# Patient Record
Sex: Female | Born: 1950 | Race: White | Hispanic: No | Marital: Married | Smoking: Never smoker
Health system: Southern US, Community
[De-identification: ages and names within clinical notes are randomized; demographics above are authoritative.]

## PROBLEM LIST (undated history)

## (undated) DIAGNOSIS — J45909 Unspecified asthma, uncomplicated: Secondary | ICD-10-CM

## (undated) DIAGNOSIS — F419 Anxiety disorder, unspecified: Secondary | ICD-10-CM

## (undated) DIAGNOSIS — I10 Essential (primary) hypertension: Secondary | ICD-10-CM

## (undated) DIAGNOSIS — N189 Chronic kidney disease, unspecified: Secondary | ICD-10-CM

## (undated) DIAGNOSIS — E785 Hyperlipidemia, unspecified: Secondary | ICD-10-CM

## (undated) HISTORY — PX: KIDNEY STONE SURGERY: SHX686

## (undated) HISTORY — PX: BREAST BIOPSY: SHX20

## (undated) HISTORY — DX: Unspecified asthma, uncomplicated: J45.909

## (undated) HISTORY — DX: Hyperlipidemia, unspecified: E78.5

## (undated) HISTORY — DX: Essential (primary) hypertension: I10

## (undated) HISTORY — DX: Anxiety disorder, unspecified: F41.9

## (undated) HISTORY — DX: Chronic kidney disease, unspecified: N18.9

---

## 1999-03-30 ENCOUNTER — Other Ambulatory Visit: Admission: RE | Admit: 1999-03-30 | Discharge: 1999-03-30 | Payer: Self-pay | Admitting: Gynecology

## 2000-07-10 ENCOUNTER — Other Ambulatory Visit: Admission: RE | Admit: 2000-07-10 | Discharge: 2000-07-10 | Payer: Self-pay | Admitting: Gynecology

## 2002-02-25 ENCOUNTER — Other Ambulatory Visit: Admission: RE | Admit: 2002-02-25 | Discharge: 2002-02-25 | Payer: Self-pay | Admitting: Gynecology

## 2003-07-07 ENCOUNTER — Other Ambulatory Visit: Admission: RE | Admit: 2003-07-07 | Discharge: 2003-07-07 | Payer: Self-pay | Admitting: Gynecology

## 2003-12-02 ENCOUNTER — Ambulatory Visit (HOSPITAL_COMMUNITY): Admission: RE | Admit: 2003-12-02 | Discharge: 2003-12-02 | Payer: Self-pay | Admitting: Gynecology

## 2004-07-15 ENCOUNTER — Other Ambulatory Visit: Admission: RE | Admit: 2004-07-15 | Discharge: 2004-07-15 | Payer: Self-pay | Admitting: Gynecology

## 2005-10-10 ENCOUNTER — Other Ambulatory Visit: Admission: RE | Admit: 2005-10-10 | Discharge: 2005-10-10 | Payer: Self-pay | Admitting: Gynecology

## 2006-01-02 ENCOUNTER — Ambulatory Visit (HOSPITAL_COMMUNITY): Admission: RE | Admit: 2006-01-02 | Discharge: 2006-01-02 | Payer: Self-pay | Admitting: Gynecology

## 2011-01-09 ENCOUNTER — Encounter: Payer: Self-pay | Admitting: Gynecology

## 2013-10-23 ENCOUNTER — Other Ambulatory Visit: Payer: Self-pay | Admitting: Gynecology

## 2013-10-23 DIAGNOSIS — R928 Other abnormal and inconclusive findings on diagnostic imaging of breast: Secondary | ICD-10-CM

## 2013-11-08 ENCOUNTER — Ambulatory Visit
Admission: RE | Admit: 2013-11-08 | Discharge: 2013-11-08 | Disposition: A | Discharge status: Home / Self Care | Payer: BC Managed Care – PPO | Source: Ambulatory Visit | Attending: Gynecology | Admitting: Gynecology

## 2013-11-08 DIAGNOSIS — R928 Other abnormal and inconclusive findings on diagnostic imaging of breast: Secondary | ICD-10-CM

## 2013-11-11 ENCOUNTER — Other Ambulatory Visit: Payer: Self-pay

## 2014-04-17 ENCOUNTER — Other Ambulatory Visit: Payer: Self-pay | Admitting: *Deleted

## 2014-04-17 DIAGNOSIS — R42 Dizziness and giddiness: Secondary | ICD-10-CM

## 2014-04-18 ENCOUNTER — Encounter: Payer: Self-pay | Admitting: Vascular Surgery

## 2014-05-27 ENCOUNTER — Encounter: Payer: Self-pay | Admitting: Vascular Surgery

## 2014-05-28 ENCOUNTER — Ambulatory Visit (HOSPITAL_COMMUNITY)
Admission: RE | Admit: 2014-05-28 | Discharge: 2014-05-28 | Disposition: A | Discharge status: Home / Self Care | Payer: BC Managed Care – PPO | Source: Ambulatory Visit | Attending: Vascular Surgery | Admitting: Vascular Surgery

## 2014-05-28 ENCOUNTER — Ambulatory Visit (INDEPENDENT_AMBULATORY_CARE_PROVIDER_SITE_OTHER): Payer: BC Managed Care – PPO | Admitting: Vascular Surgery

## 2014-05-28 ENCOUNTER — Encounter: Payer: Self-pay | Admitting: Vascular Surgery

## 2014-05-28 VITALS — BP 113/50 | HR 64 | Ht 62.0 in | Wt 115.1 lb

## 2014-05-28 DIAGNOSIS — R42 Dizziness and giddiness: Secondary | ICD-10-CM

## 2014-05-28 DIAGNOSIS — M79609 Pain in unspecified limb: Secondary | ICD-10-CM

## 2014-05-28 NOTE — Assessment & Plan Note (Signed)
I do not think her pain in the left upper extremity is related to arterial occlusive disease. He has palpable pulses in the left arm a biphasic signals on her previous Doppler study. She has a normally directed flow in her vertebral arteries and mass no evidence of subclavian artery stenosis or occlusion. Given that her pain is aggravated by activity she could potentially have neurogenic thoracic outlet syndrome or some other nerve related cause for her pain. If her symptoms progress and I would recommend evaluation by neurology. I will be happy to see her back at any time if any new vascular issues arise.

## 2014-05-28 NOTE — Progress Notes (Signed)
Patient ID: Brandi Ellis, female   DOB: 1951-11-23, 63 y.o.   MRN: [id number]  Reason for Consult: New Evaluation  Left arm pain Referred by Ernestene Kiel, MD  Subjective:     HPI:  Brandi Ellis is a 63 y.o. female who noted the gradual onset of pain in her left arm several months ago. Initially the pain was in the medial aspect of her upper arm just above the antecubital space. Infection an ultrasound of this area on 08/14/2013 which showed no discrete mass or abnormalities. She states that now the pain involves her entire left upper extremity. This is not constant pain but seems to be aggravated somewhat by certain activities. For example, she works part-time as a Theme park manager in this aggravates her arms somewhat. She denies any problems with dizziness. She denies any neck pain. She has not had any injury to her arm.  I have reviewed her records from Dr. Felipa Emory office. Her hypertension has been well controlled. She does have some issues with anxiety.  Past Medical History  Diagnosis Date  . Hypertension   . Anxiety   . Asthma   . Chronic kidney disease   . Anxiety   . Hyperlipidemia    Family History  Problem Relation Age of Onset  . Cancer Mother   . Hyperlipidemia Mother   . Hypertension Mother   . Varicose Veins Mother   . Heart disease Father     before age 25  . Hypertension Father   . Hyperlipidemia Sister   . Hypertension Sister   . Cancer Brother   . Heart disease Brother     before age 53  . Hyperlipidemia Brother   . Hypertension Brother    Past Surgical History  Procedure Laterality Date  . Cesarean section  1972 and 1977  . Kidney stone surgery     Short Social History:  History  Substance Use Topics  . Smoking status: Never Smoker   . Smokeless tobacco: Never Used  . Alcohol Use: No   Allergies  Allergen Reactions  . Codeine Sulfate   . Lipitor [Atorvastatin]   . Omnipaque [Iohexol]   . Sulfa Antibiotics   . Azithromycin Rash    Review of Systems  Constitutional: Negative for chills and fever.  Eyes: Negative for loss of vision.  Respiratory: Positive for wheezing. Negative for cough.  Cardiovascular: Positive for leg swelling. Negative for chest pain, chest tightness, claudication, dyspnea with exertion, orthopnea and palpitations.  GI: Negative for blood in stool and vomiting.  GU: Negative for dysuria and hematuria.  Musculoskeletal: Negative for leg pain, joint pain and myalgias.  Skin: Negative for rash and wound.  Neurological: Negative for dizziness and speech difficulty.  Hematologic: Negative for bruises/bleeds easily. Psychiatric: Negative for depressed mood.     For VQI Use Only: Pre-ADM Living:  [    ]Home   [    ]Nursing Home  [   ]Homeless Amb Status:  [   ] Walking [   ]Walking w/ assisitance  [   ]Wheelchair  [   ]Bed ridden     Objective:  Objective  Filed Vitals:   05/28/14 0923 05/28/14 0927  BP: 116/59 113/50  Pulse: 64   Height: 5\' 2"  (1.575 m)   Weight: 115 lb 1.6 oz (52.209 kg)   SpO2: 100%    Body mass index is 21.05 kg/(m^2).  Physical Exam  Constitutional: She is oriented to person, place, and time. She appears well-developed and  well-nourished.  HENT:  Head: Normocephalic and atraumatic.  Neck: Neck supple. No JVD present. No thyromegaly present.  Cardiovascular: Normal rate, regular rhythm and normal heart sounds.  Exam reveals no friction rub.   No murmur heard. Pulses:      Radial pulses are 2+ on the right side, and 2+ on the left side.       Dorsalis pedis pulses are 2+ on the right side.       Posterior tibial pulses are 2+ on the right side, and 2+ on the left side.  Pulmonary/Chest: Breath sounds normal. She has no wheezes. She has no rales.  Abdominal: Soft. Bowel sounds are normal. There is no tenderness.  I do not palpate an aneurysm.  Musculoskeletal: Normal range of motion. She exhibits no edema.  Lymphadenopathy:    She has no cervical adenopathy.   Neurological: She is alert and oriented to person, place, and time. She has normal strength. No sensory deficit.  Skin: No lesion and no rash noted.  Psychiatric: She has a normal mood and affect.   Data: I have reviewed her carotid duplex from 03/25/2013 which showed no evidence of carotid disease and normal flow in both vertebral arteries.  I have also reviewed her arterial Doppler study of the left upper extremity which was performed on 04/03/2014 which showed biphasic waveforms in the left upper extremity.      Assessment/Plan:     Pain in limb I do not think her pain in the left upper extremity is related to arterial occlusive disease. He has palpable pulses in the left arm a biphasic signals on her previous Doppler study. She has a normally directed flow in her vertebral arteries and mass no evidence of subclavian artery stenosis or occlusion. Given that her pain is aggravated by activity she could potentially have neurogenic thoracic outlet syndrome or some other nerve related cause for her pain. If her symptoms progress and I would recommend evaluation by neurology. I will be happy to see her back at any time if any new vascular issues arise.   Angelia Mould MD [facility] #

## 2014-11-07 ENCOUNTER — Ambulatory Visit (INDEPENDENT_AMBULATORY_CARE_PROVIDER_SITE_OTHER): Payer: BC Managed Care – PPO

## 2014-11-07 VITALS — BP 116/70 | HR 69 | Resp 12

## 2014-11-07 DIAGNOSIS — S93401A Sprain of unspecified ligament of right ankle, initial encounter: Secondary | ICD-10-CM

## 2014-11-07 DIAGNOSIS — R609 Edema, unspecified: Secondary | ICD-10-CM

## 2014-11-07 DIAGNOSIS — M79604 Pain in right leg: Secondary | ICD-10-CM

## 2014-11-07 NOTE — Progress Notes (Signed)
   Subjective:    Patient ID: Brandi Ellis, female    DOB: [birth year], 63 y.o.   MRN: [id number]  HPI PT STATED SHE FAINTED 3 WEEKS AGO AND WENT TO [facility] RO[facility] BECAUSE  THE RT LATERAL SIDE OF THE FOOT START HURTING AND THROBBING FOR 3 WEEKS. THE FOOT IS GETTING WORSE AND FOOT GET AGGRAVATED BY WALKING/SITTING. TRIED ICING BUT NO HELP.   Review of Systems  Constitutional: Positive for activity change.  HENT: Positive for sinus pressure.   Musculoskeletal: Positive for gait problem.  Hematological: Bruises/bleeds easily.  All other systems reviewed and are negative.      Objective:   Physical Exam 63 year old white female well-developed well-nourished oriented 3 presents at this time proxy 3 weeks status post lateral ankle sprain of the right ankle. The sprain occurred when she was walking shoe. We fainted and turned her ankle has pain along the lateral malleolar area extending from the lateral malleolus lateral ankle all the way to the base of the fifth metatarsal lateral midfoot. There is history of ecchymosis and continues to have pain on direct palpation most significant pain in the sinus tarsi over the anterior talofibular ligament and calcaneal fibular ligament posterior to the malleolus is not as painful and tender. X-rays taken a week ago at [facility] revealed possibly some irritation or periosteal reaction of the fibular head on the ankle views however no acute fractures identified no displacements the ankle mortise is intact with no significant joint space changes noted. No fractures cyst tumors no other osseous abnormalities noted. Again neurovascular status is intact pedal pulses are palpable epicritic and proprioceptive sensations intact and symmetric bilateral there is normal plantar response and DTRs. Dermatologic his skin color pigment normal history of ecchymosis although that is cleared       Assessment & Plan:  Assessment lateral ankle sprain likely  tear of the anterior talofibular possible calcaneal fib ligament involvement plan at this time patient placed in an ankle stabilizer maintained for 3-4 weeks as instructed instructions are provided for ankle stabilization with a stabilizer AFO brace maintained in recheck in 4 weeks for follow-up also recommended ice and elevation whenever possible and moderation of activities maintain a good stable shoe avoid any uneven are unstable surfaces. Reschedule in 4 weeks  Harriet Masson DPM

## 2014-11-07 NOTE — Patient Instructions (Signed)
Ankle Sprain An ankle sprain is an injury to the strong, fibrous tissues (ligaments) that hold the bones of your ankle joint together.  CAUSES An ankle sprain is usually caused by a fall or by twisting your ankle. Ankle sprains most commonly occur when you step on the outer edge of your foot, and your ankle turns inward. People who participate in sports are more prone to these types of injuries.  SYMPTOMS   Pain in your ankle. The pain may be present at rest or only when you are trying to stand or walk.  Swelling.  Bruising. Bruising may develop immediately or within 1 to 2 days after your injury.  Difficulty standing or walking, particularly when turning corners or changing directions. DIAGNOSIS  Your caregiver will ask you details about your injury and perform a physical exam of your ankle to determine if you have an ankle sprain. During the physical exam, your caregiver will press on and apply pressure to specific areas of your foot and ankle. Your caregiver will try to move your ankle in certain ways. An X-ray exam may be done to be sure a bone was not broken or a ligament did not separate from one of the bones in your ankle (avulsion fracture).  TREATMENT  Certain types of braces can help stabilize your ankle. Your caregiver can make a recommendation for this. Your caregiver may recommend the use of medicine for pain. If your sprain is severe, your caregiver may refer you to a surgeon who helps to restore function to parts of your skeletal system (orthopedist) or a physical therapist. [place] ice to your injury for 1-2 days or as directed by your caregiver. Applying ice helps to reduce inflammation and pain.  Put ice in a plastic bag.  Place a towel between your skin and the bag.  Leave the ice on for 15-20 minutes at a time, every 2 hours while you are awake.  Only take over-the-counter or prescription medicines for pain, discomfort, or fever as directed by  your caregiver.  Elevate your injured ankle above the level of your heart as much as possible for 2-3 days.  If your caregiver recommends crutches, use them as instructed. Gradually put weight on the affected ankle. Continue to use crutches or a cane until you can walk without feeling pain in your ankle.  If you have a plaster splint, wear the splint as directed by your caregiver. Do not rest it on anything harder than a pillow for the first 24 hours. Do not put weight on it. Do not get it wet. You may take it off to take a shower or bath.  You may have been given an elastic bandage to wear around your ankle to provide support. If the elastic bandage is too tight (you have numbness or tingling in your foot or your foot becomes cold and blue), adjust the bandage to make it comfortable.  If you have an air splint, you may blow more air into it or let air out to make it more comfortable. You may take your splint off at night and before taking a shower or bath. Wiggle your toes in the splint several times per day to decrease swelling. SEEK MEDICAL CARE IF:   You have rapidly increasing bruising or swelling.  Your toes feel extremely cold or you lose feeling in your foot.  Your pain is not relieved with medicine. SEEK IMMEDIATE MEDICAL CARE IF:  Your toes are numb or blue.  You have severe pain that is increasing. MAKE SURE YOU:   Understand these instructions.  Will watch your condition.  Will get help right away if you are not doing well or get worse. Document Released: 12/05/2005 Document Revised: 08/29/2012 Document Reviewed: 12/17/2011 [facility] Patient Information 2015 [place], [place]. This information is not intended to replace advice given to you by your health care provider. Make sure you discuss any questions you have with your health care provider.  Maintain the ankle stabilizer or 3-4 weeks as instructed, to allow the ligaments to strengthen and heal

## 2014-12-05 ENCOUNTER — Ambulatory Visit (INDEPENDENT_AMBULATORY_CARE_PROVIDER_SITE_OTHER): Payer: BC Managed Care – PPO

## 2014-12-05 VITALS — BP 110/64 | HR 76 | Resp 12

## 2014-12-05 DIAGNOSIS — S93401A Sprain of unspecified ligament of right ankle, initial encounter: Secondary | ICD-10-CM

## 2014-12-05 DIAGNOSIS — M79604 Pain in right leg: Secondary | ICD-10-CM

## 2014-12-05 DIAGNOSIS — R609 Edema, unspecified: Secondary | ICD-10-CM

## 2014-12-05 NOTE — Progress Notes (Signed)
   Subjective:    Patient ID: Brandi Ellis, female    DOB: [birth year], 63 y.o.   MRN: [id number]  HPI  ''RT FOOT ANKLE STILL HURTING ESPECIALLY WHEN PUTTING PRESSURE ON IT.''  Review of Systems no new findings or systemic changes noted     Objective:   Physical Exam  Lower extremity objective findings unchanged patient has a lateral ankle sprain of the right ankle ecchymosis of the ankle has cleared at this time still tender along the anterior talofibular and calcaneofibular ligament areas also some tenderness and mid leg patient been inversion type sprain of the medial ankle does have some slight tenderness per hour mostly patient is complaining of aching or throbbing sensation no sharp shooting pain no click no crepitus in the joint and movement of the ankle      Assessment & Plan:  Follow-up at this time reveals improving ankle sprain with managed edema no ecchymosis noted no increased pain tenderness discomfort no dehiscence there ankle seems to be stabilizing well is wearing the ankle stabilizer most the time. This time stressed using stabilizer all times with a good walking or athletic shoe maintain stabilizer for at least another 1-2 months as instructed if not improved or resolving within 2 months follow-up may be candidate for MRI evaluation for soft tissue injury or damage nonhealing ligament injury or cartilaginous damage. Reappointed 2 months as indicated  Harriet Masson DPM

## 2014-12-05 NOTE — Patient Instructions (Signed)

## 2014-12-29 ENCOUNTER — Ambulatory Visit (INDEPENDENT_AMBULATORY_CARE_PROVIDER_SITE_OTHER): Payer: BLUE CROSS/BLUE SHIELD

## 2014-12-29 VITALS — BP 112/70 | HR 88 | Resp 12

## 2014-12-29 DIAGNOSIS — M5416 Radiculopathy, lumbar region: Secondary | ICD-10-CM

## 2014-12-29 DIAGNOSIS — M7751 Other enthesopathy of right foot: Secondary | ICD-10-CM

## 2014-12-29 DIAGNOSIS — S93401D Sprain of unspecified ligament of right ankle, subsequent encounter: Secondary | ICD-10-CM

## 2014-12-29 DIAGNOSIS — M79604 Pain in right leg: Secondary | ICD-10-CM

## 2014-12-29 DIAGNOSIS — M158 Other polyosteoarthritis: Secondary | ICD-10-CM

## 2014-12-29 DIAGNOSIS — M5431 Sciatica, right side: Secondary | ICD-10-CM

## 2014-12-29 NOTE — Progress Notes (Signed)
   Subjective:    Patient ID: Brandi Ellis, female    DOB: Dec 03, 1951, 64 y.o.   MRN: [id number]  HPI  ''RT FOOT ANKLE STILL THROBS AND PAIN GOING UP THE LEG/HIP.'' Recently the patient is at a car for 5 hours on a trip and after the trip developed pain aching and throbbing abnormal sensations shooting sensation from the hip down into the foot leg. Subtalar abnormal sensation was noted no history of injury or back problems in the past however does have aching in the hip area and upper and lower leg areas. She's tried some anti-inflammatories with minimal improvement  Review of Systems no new findings or systemic changes noted     Objective:   Physical Exam Lower extremity objective findings reveal pedal pulses palpable DP and PT +2 over 4 Refill time 3 seconds epicritic and proprioceptive sensations intact and symmetric bilateral there is normal plantar response and DTRs noted dermatologically skin color pigment normal hair growth absent nails unremarkable orthopedic exam there is rectus foot type ankle metatarsal subtalar joint motions normal there is some tenderness and Lisfranc's both the sinus tarsi area and somewhat along Lisfranc's fourth fifth metatarsal base and cuboid on inversion and eversion this is consistent with mild osteoarthropathy of the foot and then residual sequela of her right lateral ankle sprain. There is no acute pain or tenderness on inversion or eversion or pressure in the sinus tarsi or ankle mortise. No new x-rays taken at this time. Slight about 2 months since her initial injury advised that the ankle sprain can take to 3 months or more to resolve however the swelling is going down ecchymosis is gone most her pain is not in the ankle at this time she is actually complaining more about pain in her leg and hip area and thigh area.       Assessment & Plan:  Assessment resolving are improving ankle sprain lateral right ankle however this time cannot rule out a lumbar  radiculopathy or lumbar sciatic type symptomology of her right hip and lower leg. At this time I feel patient would benefit from evaluation of her hip and lumbar spine by either orthoses or neuro and she has requested [facility] [place] [facility] has been seen by Dr. Dianna Limbo past referred to [facility] orthopedics. Patient warrants an evaluation of her hip and back rule out possibly some degree of arthropathy versus any neurologic manifestations which could be which would worn alternate type treatment. Whether this back problems associated with her fall or injury is inconclusive patient in the past did not complain anything about hip or back or thigh pain her pain and been strictly around the ankle to be more new symptom which developed after her 5 hour car trip. Referred to orthopedics follow-up with foot problems if continue be an issue with the next month or 2 would be candidate for MRI of the foot  . Harriet Masson DPM

## 2014-12-29 NOTE — Patient Instructions (Signed)
ICE INSTRUCTIONS  Apply ice or cold pack to the affected area at least 3 times a day for 10-15 minutes each time.  You should also use ice after prolonged activity or vigorous exercise.  Do not apply ice longer than 20 minutes at one time.  Always keep a cloth between your skin and the ice pack to prevent burns.  Being consistent and following these instructions will help control your symptoms.  We suggest you purchase a gel ice pack because they are reusable and do bit leak.  Some of them are designed to wrap around the area.  Use the method that works best for you.  Here are some other suggestions for icing.   Use a frozen bag of peas or corn-inexpensive and molds well to your body, usually stays frozen for 10 to 20 minutes.  Wet a towel with cold water and squeeze out the excess until it's damp.  Place in a bag in the freezer for 20 minutes. Then remove and use.  Continue with ice and using the ankle stabilizer for right ankle sprain. Next  However if you continue have increased leg and hip pain and abnormality follow-up with orthopedics as recommended for evaluation of either lumbar radiculopathy or sciatic type issues affecting the right side.

## 2015-01-01 ENCOUNTER — Ambulatory Visit: Payer: Self-pay

## 2015-12-23 ENCOUNTER — Other Ambulatory Visit: Payer: Self-pay | Admitting: Obstetrics & Gynecology

## 2015-12-23 DIAGNOSIS — E2839 Other primary ovarian failure: Secondary | ICD-10-CM

## 2016-01-21 ENCOUNTER — Ambulatory Visit
Admission: RE | Admit: 2016-01-21 | Discharge: 2016-01-21 | Disposition: A | Discharge status: Home / Self Care | Payer: BLUE CROSS/BLUE SHIELD | Source: Ambulatory Visit | Attending: Obstetrics & Gynecology | Admitting: Obstetrics & Gynecology

## 2016-01-21 DIAGNOSIS — E2839 Other primary ovarian failure: Secondary | ICD-10-CM

## 2016-05-11 DIAGNOSIS — R21 Rash and other nonspecific skin eruption: Secondary | ICD-10-CM | POA: Diagnosis not present

## 2016-05-26 DIAGNOSIS — Z79899 Other long term (current) drug therapy: Secondary | ICD-10-CM | POA: Diagnosis not present

## 2016-05-26 DIAGNOSIS — E785 Hyperlipidemia, unspecified: Secondary | ICD-10-CM | POA: Diagnosis not present

## 2016-05-26 DIAGNOSIS — I10 Essential (primary) hypertension: Secondary | ICD-10-CM | POA: Diagnosis not present

## 2016-08-31 DIAGNOSIS — J019 Acute sinusitis, unspecified: Secondary | ICD-10-CM | POA: Diagnosis not present

## 2016-11-25 DIAGNOSIS — M549 Dorsalgia, unspecified: Secondary | ICD-10-CM | POA: Diagnosis not present

## 2016-11-25 DIAGNOSIS — Z1231 Encounter for screening mammogram for malignant neoplasm of breast: Secondary | ICD-10-CM | POA: Diagnosis not present

## 2016-11-25 DIAGNOSIS — M810 Age-related osteoporosis without current pathological fracture: Secondary | ICD-10-CM | POA: Diagnosis not present

## 2016-11-25 DIAGNOSIS — Z79899 Other long term (current) drug therapy: Secondary | ICD-10-CM | POA: Diagnosis not present

## 2016-11-25 DIAGNOSIS — I10 Essential (primary) hypertension: Secondary | ICD-10-CM | POA: Diagnosis not present

## 2016-12-26 DIAGNOSIS — M810 Age-related osteoporosis without current pathological fracture: Secondary | ICD-10-CM | POA: Diagnosis not present

## 2016-12-30 DIAGNOSIS — Z1231 Encounter for screening mammogram for malignant neoplasm of breast: Secondary | ICD-10-CM | POA: Diagnosis not present

## 2017-03-16 DIAGNOSIS — Z1211 Encounter for screening for malignant neoplasm of colon: Secondary | ICD-10-CM | POA: Diagnosis not present

## 2017-04-12 DIAGNOSIS — J0100 Acute maxillary sinusitis, unspecified: Secondary | ICD-10-CM | POA: Diagnosis not present

## 2017-05-22 DIAGNOSIS — Z1211 Encounter for screening for malignant neoplasm of colon: Secondary | ICD-10-CM | POA: Diagnosis not present

## 2017-05-22 DIAGNOSIS — I10 Essential (primary) hypertension: Secondary | ICD-10-CM | POA: Diagnosis not present

## 2017-05-22 DIAGNOSIS — K5730 Diverticulosis of large intestine without perforation or abscess without bleeding: Secondary | ICD-10-CM | POA: Diagnosis not present

## 2017-05-22 DIAGNOSIS — K635 Polyp of colon: Secondary | ICD-10-CM | POA: Diagnosis not present

## 2017-05-22 DIAGNOSIS — K648 Other hemorrhoids: Secondary | ICD-10-CM | POA: Diagnosis not present

## 2017-05-22 DIAGNOSIS — D122 Benign neoplasm of ascending colon: Secondary | ICD-10-CM | POA: Diagnosis not present

## 2017-05-26 DIAGNOSIS — R55 Syncope and collapse: Secondary | ICD-10-CM | POA: Diagnosis not present

## 2017-05-26 DIAGNOSIS — E876 Hypokalemia: Secondary | ICD-10-CM | POA: Diagnosis not present

## 2017-05-26 DIAGNOSIS — I10 Essential (primary) hypertension: Secondary | ICD-10-CM | POA: Diagnosis not present

## 2017-05-26 DIAGNOSIS — Z79899 Other long term (current) drug therapy: Secondary | ICD-10-CM | POA: Diagnosis not present

## 2017-09-16 DIAGNOSIS — H8122 Vestibular neuronitis, left ear: Secondary | ICD-10-CM | POA: Diagnosis not present

## 2017-09-27 DIAGNOSIS — I10 Essential (primary) hypertension: Secondary | ICD-10-CM | POA: Diagnosis not present

## 2017-09-27 DIAGNOSIS — E785 Hyperlipidemia, unspecified: Secondary | ICD-10-CM | POA: Diagnosis not present

## 2017-09-27 DIAGNOSIS — R21 Rash and other nonspecific skin eruption: Secondary | ICD-10-CM | POA: Diagnosis not present

## 2017-09-27 DIAGNOSIS — M810 Age-related osteoporosis without current pathological fracture: Secondary | ICD-10-CM | POA: Diagnosis not present

## 2017-09-27 DIAGNOSIS — Z79899 Other long term (current) drug therapy: Secondary | ICD-10-CM | POA: Diagnosis not present

## 2017-12-08 DIAGNOSIS — J0140 Acute pansinusitis, unspecified: Secondary | ICD-10-CM | POA: Diagnosis not present

## 2017-12-08 DIAGNOSIS — R52 Pain, unspecified: Secondary | ICD-10-CM | POA: Diagnosis not present

## 2017-12-08 DIAGNOSIS — R05 Cough: Secondary | ICD-10-CM | POA: Diagnosis not present

## 2017-12-18 DIAGNOSIS — R5381 Other malaise: Secondary | ICD-10-CM | POA: Diagnosis not present

## 2017-12-18 DIAGNOSIS — R062 Wheezing: Secondary | ICD-10-CM | POA: Diagnosis not present

## 2017-12-18 DIAGNOSIS — R5383 Other fatigue: Secondary | ICD-10-CM | POA: Diagnosis not present

## 2018-01-11 DIAGNOSIS — R05 Cough: Secondary | ICD-10-CM | POA: Diagnosis not present

## 2018-01-11 DIAGNOSIS — H6981 Other specified disorders of Eustachian tube, right ear: Secondary | ICD-10-CM | POA: Diagnosis not present

## 2018-01-11 DIAGNOSIS — J019 Acute sinusitis, unspecified: Secondary | ICD-10-CM | POA: Diagnosis not present

## 2018-01-15 DIAGNOSIS — R0602 Shortness of breath: Secondary | ICD-10-CM | POA: Diagnosis not present

## 2018-01-24 DIAGNOSIS — Z124 Encounter for screening for malignant neoplasm of cervix: Secondary | ICD-10-CM | POA: Diagnosis not present

## 2018-01-24 DIAGNOSIS — Z1231 Encounter for screening mammogram for malignant neoplasm of breast: Secondary | ICD-10-CM | POA: Diagnosis not present

## 2018-01-24 DIAGNOSIS — Z01419 Encounter for gynecological examination (general) (routine) without abnormal findings: Secondary | ICD-10-CM | POA: Diagnosis not present

## 2018-01-29 DIAGNOSIS — M810 Age-related osteoporosis without current pathological fracture: Secondary | ICD-10-CM | POA: Diagnosis not present

## 2018-01-29 DIAGNOSIS — E785 Hyperlipidemia, unspecified: Secondary | ICD-10-CM | POA: Diagnosis not present

## 2018-01-29 DIAGNOSIS — I10 Essential (primary) hypertension: Secondary | ICD-10-CM | POA: Diagnosis not present

## 2018-01-29 DIAGNOSIS — Z79899 Other long term (current) drug therapy: Secondary | ICD-10-CM | POA: Diagnosis not present

## 2018-03-28 DIAGNOSIS — M549 Dorsalgia, unspecified: Secondary | ICD-10-CM | POA: Diagnosis not present

## 2018-05-17 DIAGNOSIS — M25561 Pain in right knee: Secondary | ICD-10-CM | POA: Diagnosis not present

## 2018-05-17 DIAGNOSIS — M79671 Pain in right foot: Secondary | ICD-10-CM | POA: Diagnosis not present

## 2018-05-17 DIAGNOSIS — M549 Dorsalgia, unspecified: Secondary | ICD-10-CM | POA: Diagnosis not present

## 2018-05-18 DIAGNOSIS — M25561 Pain in right knee: Secondary | ICD-10-CM | POA: Diagnosis not present

## 2018-05-18 DIAGNOSIS — S8991XA Unspecified injury of right lower leg, initial encounter: Secondary | ICD-10-CM | POA: Diagnosis not present

## 2018-06-18 DIAGNOSIS — J069 Acute upper respiratory infection, unspecified: Secondary | ICD-10-CM | POA: Diagnosis not present

## 2018-07-30 DIAGNOSIS — R110 Nausea: Secondary | ICD-10-CM | POA: Diagnosis not present

## 2018-07-30 DIAGNOSIS — R490 Dysphonia: Secondary | ICD-10-CM | POA: Diagnosis not present

## 2018-07-30 DIAGNOSIS — I10 Essential (primary) hypertension: Secondary | ICD-10-CM | POA: Diagnosis not present

## 2018-07-30 DIAGNOSIS — M25512 Pain in left shoulder: Secondary | ICD-10-CM | POA: Diagnosis not present

## 2018-07-30 DIAGNOSIS — R5383 Other fatigue: Secondary | ICD-10-CM | POA: Diagnosis not present

## 2018-07-30 DIAGNOSIS — E785 Hyperlipidemia, unspecified: Secondary | ICD-10-CM | POA: Diagnosis not present

## 2018-07-30 DIAGNOSIS — Z79899 Other long term (current) drug therapy: Secondary | ICD-10-CM | POA: Diagnosis not present

## 2018-09-25 DIAGNOSIS — M5441 Lumbago with sciatica, right side: Secondary | ICD-10-CM | POA: Diagnosis not present

## 2018-09-25 DIAGNOSIS — Z0001 Encounter for general adult medical examination with abnormal findings: Secondary | ICD-10-CM | POA: Diagnosis not present

## 2018-09-25 DIAGNOSIS — D692 Other nonthrombocytopenic purpura: Secondary | ICD-10-CM | POA: Diagnosis not present

## 2018-10-19 DIAGNOSIS — M545 Low back pain: Secondary | ICD-10-CM | POA: Diagnosis not present

## 2018-10-25 DIAGNOSIS — M545 Low back pain: Secondary | ICD-10-CM | POA: Diagnosis not present

## 2018-10-25 DIAGNOSIS — M9904 Segmental and somatic dysfunction of sacral region: Secondary | ICD-10-CM | POA: Diagnosis not present

## 2018-10-29 DIAGNOSIS — H6593 Unspecified nonsuppurative otitis media, bilateral: Secondary | ICD-10-CM | POA: Diagnosis not present

## 2018-11-01 DIAGNOSIS — H66001 Acute suppurative otitis media without spontaneous rupture of ear drum, right ear: Secondary | ICD-10-CM | POA: Diagnosis not present

## 2018-11-13 DIAGNOSIS — H5203 Hypermetropia, bilateral: Secondary | ICD-10-CM | POA: Diagnosis not present

## 2018-12-20 DIAGNOSIS — J4521 Mild intermittent asthma with (acute) exacerbation: Secondary | ICD-10-CM | POA: Diagnosis not present

## 2018-12-20 DIAGNOSIS — J0140 Acute pansinusitis, unspecified: Secondary | ICD-10-CM | POA: Diagnosis not present

## 2019-01-30 DIAGNOSIS — R42 Dizziness and giddiness: Secondary | ICD-10-CM | POA: Diagnosis not present

## 2019-01-30 DIAGNOSIS — J0140 Acute pansinusitis, unspecified: Secondary | ICD-10-CM | POA: Diagnosis not present

## 2019-03-06 DIAGNOSIS — M9904 Segmental and somatic dysfunction of sacral region: Secondary | ICD-10-CM | POA: Diagnosis not present

## 2019-06-27 DIAGNOSIS — I639 Cerebral infarction, unspecified: Secondary | ICD-10-CM | POA: Diagnosis not present

## 2019-06-27 DIAGNOSIS — R531 Weakness: Secondary | ICD-10-CM | POA: Diagnosis not present

## 2019-06-27 DIAGNOSIS — G459 Transient cerebral ischemic attack, unspecified: Secondary | ICD-10-CM | POA: Diagnosis not present

## 2019-06-27 DIAGNOSIS — A419 Sepsis, unspecified organism: Secondary | ICD-10-CM | POA: Diagnosis not present

## 2019-06-27 DIAGNOSIS — J4520 Mild intermittent asthma, uncomplicated: Secondary | ICD-10-CM | POA: Diagnosis not present

## 2019-06-27 DIAGNOSIS — E785 Hyperlipidemia, unspecified: Secondary | ICD-10-CM | POA: Diagnosis not present

## 2019-06-27 DIAGNOSIS — B9689 Other specified bacterial agents as the cause of diseases classified elsewhere: Secondary | ICD-10-CM | POA: Diagnosis not present

## 2019-06-27 DIAGNOSIS — R29898 Other symptoms and signs involving the musculoskeletal system: Secondary | ICD-10-CM | POA: Diagnosis not present

## 2019-06-27 DIAGNOSIS — I10 Essential (primary) hypertension: Secondary | ICD-10-CM | POA: Diagnosis not present

## 2019-06-27 DIAGNOSIS — R200 Anesthesia of skin: Secondary | ICD-10-CM | POA: Diagnosis not present

## 2019-06-27 DIAGNOSIS — R55 Syncope and collapse: Secondary | ICD-10-CM | POA: Diagnosis not present

## 2019-06-27 DIAGNOSIS — R202 Paresthesia of skin: Secondary | ICD-10-CM | POA: Diagnosis not present

## 2019-06-27 DIAGNOSIS — Z79899 Other long term (current) drug therapy: Secondary | ICD-10-CM | POA: Diagnosis not present

## 2019-06-27 DIAGNOSIS — J019 Acute sinusitis, unspecified: Secondary | ICD-10-CM | POA: Diagnosis not present

## 2019-06-27 DIAGNOSIS — R42 Dizziness and giddiness: Secondary | ICD-10-CM | POA: Diagnosis not present

## 2019-06-28 DIAGNOSIS — I371 Nonrheumatic pulmonary valve insufficiency: Secondary | ICD-10-CM | POA: Diagnosis not present

## 2019-06-28 DIAGNOSIS — R55 Syncope and collapse: Secondary | ICD-10-CM | POA: Diagnosis not present

## 2019-06-28 DIAGNOSIS — I081 Rheumatic disorders of both mitral and tricuspid valves: Secondary | ICD-10-CM | POA: Diagnosis not present

## 2019-06-28 DIAGNOSIS — G459 Transient cerebral ischemic attack, unspecified: Secondary | ICD-10-CM | POA: Diagnosis not present

## 2019-06-28 DIAGNOSIS — E785 Hyperlipidemia, unspecified: Secondary | ICD-10-CM | POA: Diagnosis not present

## 2019-06-28 DIAGNOSIS — I10 Essential (primary) hypertension: Secondary | ICD-10-CM | POA: Diagnosis not present

## 2019-07-25 DIAGNOSIS — R55 Syncope and collapse: Secondary | ICD-10-CM | POA: Diagnosis not present

## 2019-07-25 DIAGNOSIS — E538 Deficiency of other specified B group vitamins: Secondary | ICD-10-CM | POA: Diagnosis not present

## 2019-07-25 DIAGNOSIS — R5383 Other fatigue: Secondary | ICD-10-CM | POA: Diagnosis not present

## 2019-07-25 DIAGNOSIS — I10 Essential (primary) hypertension: Secondary | ICD-10-CM | POA: Diagnosis not present

## 2019-10-21 ENCOUNTER — Other Ambulatory Visit: Payer: Self-pay | Admitting: Internal Medicine

## 2019-10-21 ENCOUNTER — Other Ambulatory Visit: Payer: Self-pay | Admitting: Obstetrics & Gynecology

## 2019-10-21 DIAGNOSIS — Z1231 Encounter for screening mammogram for malignant neoplasm of breast: Secondary | ICD-10-CM

## 2019-10-25 ENCOUNTER — Ambulatory Visit
Admission: RE | Admit: 2019-10-25 | Discharge: 2019-10-25 | Disposition: A | Discharge status: Home / Self Care | Payer: Medicare Other | Source: Ambulatory Visit | Attending: Internal Medicine | Admitting: Internal Medicine

## 2019-10-25 ENCOUNTER — Other Ambulatory Visit: Payer: Self-pay

## 2019-10-25 DIAGNOSIS — Z1231 Encounter for screening mammogram for malignant neoplasm of breast: Secondary | ICD-10-CM | POA: Diagnosis not present

## 2019-11-25 DIAGNOSIS — J069 Acute upper respiratory infection, unspecified: Secondary | ICD-10-CM | POA: Diagnosis not present

## 2019-12-09 DIAGNOSIS — H66001 Acute suppurative otitis media without spontaneous rupture of ear drum, right ear: Secondary | ICD-10-CM | POA: Diagnosis not present

## 2019-12-09 DIAGNOSIS — J00 Acute nasopharyngitis [common cold]: Secondary | ICD-10-CM | POA: Diagnosis not present

## 2019-12-25 DIAGNOSIS — R5383 Other fatigue: Secondary | ICD-10-CM | POA: Diagnosis not present

## 2019-12-25 DIAGNOSIS — Z79899 Other long term (current) drug therapy: Secondary | ICD-10-CM | POA: Diagnosis not present

## 2019-12-25 DIAGNOSIS — I10 Essential (primary) hypertension: Secondary | ICD-10-CM | POA: Diagnosis not present

## 2019-12-25 DIAGNOSIS — J302 Other seasonal allergic rhinitis: Secondary | ICD-10-CM | POA: Diagnosis not present

## 2019-12-25 DIAGNOSIS — E538 Deficiency of other specified B group vitamins: Secondary | ICD-10-CM | POA: Diagnosis not present

## 2020-01-02 DIAGNOSIS — B029 Zoster without complications: Secondary | ICD-10-CM | POA: Diagnosis not present

## 2020-01-27 DIAGNOSIS — Z743 Need for continuous supervision: Secondary | ICD-10-CM | POA: Diagnosis not present

## 2020-01-27 DIAGNOSIS — I493 Ventricular premature depolarization: Secondary | ICD-10-CM | POA: Diagnosis not present

## 2020-01-27 DIAGNOSIS — Z79899 Other long term (current) drug therapy: Secondary | ICD-10-CM | POA: Diagnosis not present

## 2020-01-27 DIAGNOSIS — Z7951 Long term (current) use of inhaled steroids: Secondary | ICD-10-CM | POA: Diagnosis not present

## 2020-01-27 DIAGNOSIS — M1990 Unspecified osteoarthritis, unspecified site: Secondary | ICD-10-CM | POA: Diagnosis not present

## 2020-01-27 DIAGNOSIS — R519 Headache, unspecified: Secondary | ICD-10-CM | POA: Diagnosis not present

## 2020-01-27 DIAGNOSIS — R110 Nausea: Secondary | ICD-10-CM | POA: Diagnosis not present

## 2020-01-27 DIAGNOSIS — J45909 Unspecified asthma, uncomplicated: Secondary | ICD-10-CM | POA: Diagnosis not present

## 2020-01-27 DIAGNOSIS — R55 Syncope and collapse: Secondary | ICD-10-CM | POA: Diagnosis not present

## 2020-01-27 DIAGNOSIS — E876 Hypokalemia: Secondary | ICD-10-CM | POA: Diagnosis not present

## 2020-01-27 DIAGNOSIS — I959 Hypotension, unspecified: Secondary | ICD-10-CM | POA: Diagnosis not present

## 2020-01-27 DIAGNOSIS — I10 Essential (primary) hypertension: Secondary | ICD-10-CM | POA: Diagnosis not present

## 2020-02-04 DIAGNOSIS — Z6821 Body mass index (BMI) 21.0-21.9, adult: Secondary | ICD-10-CM | POA: Diagnosis not present

## 2020-02-04 DIAGNOSIS — R42 Dizziness and giddiness: Secondary | ICD-10-CM | POA: Diagnosis not present

## 2020-02-04 DIAGNOSIS — E876 Hypokalemia: Secondary | ICD-10-CM | POA: Diagnosis not present

## 2020-02-11 DIAGNOSIS — E876 Hypokalemia: Secondary | ICD-10-CM | POA: Diagnosis not present

## 2020-02-11 DIAGNOSIS — M810 Age-related osteoporosis without current pathological fracture: Secondary | ICD-10-CM | POA: Diagnosis not present

## 2020-02-20 DIAGNOSIS — H6593 Unspecified nonsuppurative otitis media, bilateral: Secondary | ICD-10-CM | POA: Diagnosis not present

## 2020-03-05 DIAGNOSIS — E538 Deficiency of other specified B group vitamins: Secondary | ICD-10-CM | POA: Diagnosis not present

## 2020-03-05 DIAGNOSIS — I10 Essential (primary) hypertension: Secondary | ICD-10-CM | POA: Diagnosis not present

## 2020-03-05 DIAGNOSIS — Z131 Encounter for screening for diabetes mellitus: Secondary | ICD-10-CM | POA: Diagnosis not present

## 2020-03-05 DIAGNOSIS — G459 Transient cerebral ischemic attack, unspecified: Secondary | ICD-10-CM | POA: Diagnosis not present

## 2020-03-05 DIAGNOSIS — E785 Hyperlipidemia, unspecified: Secondary | ICD-10-CM | POA: Diagnosis not present

## 2020-03-16 DIAGNOSIS — G459 Transient cerebral ischemic attack, unspecified: Secondary | ICD-10-CM | POA: Diagnosis not present

## 2020-03-16 DIAGNOSIS — I6523 Occlusion and stenosis of bilateral carotid arteries: Secondary | ICD-10-CM | POA: Diagnosis not present

## 2020-04-08 DIAGNOSIS — H2513 Age-related nuclear cataract, bilateral: Secondary | ICD-10-CM | POA: Diagnosis not present

## 2020-05-13 DIAGNOSIS — E785 Hyperlipidemia, unspecified: Secondary | ICD-10-CM | POA: Diagnosis not present

## 2020-06-08 DIAGNOSIS — W19XXXA Unspecified fall, initial encounter: Secondary | ICD-10-CM | POA: Diagnosis not present

## 2020-06-08 DIAGNOSIS — S81811A Laceration without foreign body, right lower leg, initial encounter: Secondary | ICD-10-CM | POA: Diagnosis not present

## 2020-09-17 DIAGNOSIS — Z20822 Contact with and (suspected) exposure to covid-19: Secondary | ICD-10-CM | POA: Diagnosis not present

## 2020-10-19 DIAGNOSIS — Z131 Encounter for screening for diabetes mellitus: Secondary | ICD-10-CM | POA: Diagnosis not present

## 2020-10-19 DIAGNOSIS — I10 Essential (primary) hypertension: Secondary | ICD-10-CM | POA: Diagnosis not present

## 2020-10-19 DIAGNOSIS — E538 Deficiency of other specified B group vitamins: Secondary | ICD-10-CM | POA: Diagnosis not present

## 2020-10-19 DIAGNOSIS — Z23 Encounter for immunization: Secondary | ICD-10-CM | POA: Diagnosis not present

## 2020-10-19 DIAGNOSIS — E785 Hyperlipidemia, unspecified: Secondary | ICD-10-CM | POA: Diagnosis not present

## 2020-10-20 ENCOUNTER — Other Ambulatory Visit: Payer: Self-pay | Admitting: Family Medicine

## 2020-10-20 DIAGNOSIS — Z1231 Encounter for screening mammogram for malignant neoplasm of breast: Secondary | ICD-10-CM

## 2020-11-27 ENCOUNTER — Ambulatory Visit: Payer: Medicare Other

## 2020-11-29 DIAGNOSIS — J4520 Mild intermittent asthma, uncomplicated: Secondary | ICD-10-CM | POA: Diagnosis not present

## 2020-11-29 DIAGNOSIS — J069 Acute upper respiratory infection, unspecified: Secondary | ICD-10-CM | POA: Diagnosis not present

## 2020-11-29 DIAGNOSIS — Z20822 Contact with and (suspected) exposure to covid-19: Secondary | ICD-10-CM | POA: Diagnosis not present

## 2021-01-08 ENCOUNTER — Ambulatory Visit: Payer: Medicare Other

## 2021-01-20 DIAGNOSIS — H2513 Age-related nuclear cataract, bilateral: Secondary | ICD-10-CM | POA: Diagnosis not present

## 2021-02-18 ENCOUNTER — Ambulatory Visit: Payer: Medicare Other

## 2021-03-29 DIAGNOSIS — M9904 Segmental and somatic dysfunction of sacral region: Secondary | ICD-10-CM | POA: Diagnosis not present

## 2021-03-29 DIAGNOSIS — M5459 Other low back pain: Secondary | ICD-10-CM | POA: Diagnosis not present

## 2021-04-08 ENCOUNTER — Ambulatory Visit: Payer: Medicare Other

## 2021-04-14 DIAGNOSIS — M533 Sacrococcygeal disorders, not elsewhere classified: Secondary | ICD-10-CM | POA: Diagnosis not present

## 2021-07-05 DIAGNOSIS — M545 Low back pain, unspecified: Secondary | ICD-10-CM | POA: Diagnosis not present

## 2021-09-20 ENCOUNTER — Emergency Department (HOSPITAL_COMMUNITY)
Admission: EM | Admit: 2021-09-20 | Discharge: 2021-09-21 | Disposition: A | Discharge status: Home / Self Care | Payer: Medicare Other | Attending: Emergency Medicine | Admitting: Emergency Medicine

## 2021-09-20 ENCOUNTER — Other Ambulatory Visit: Payer: Self-pay

## 2021-09-20 ENCOUNTER — Encounter (HOSPITAL_COMMUNITY): Payer: Self-pay | Admitting: *Deleted

## 2021-09-20 ENCOUNTER — Emergency Department (HOSPITAL_COMMUNITY): Payer: Medicare Other

## 2021-09-20 DIAGNOSIS — S93402A Sprain of unspecified ligament of left ankle, initial encounter: Secondary | ICD-10-CM

## 2021-09-20 DIAGNOSIS — N189 Chronic kidney disease, unspecified: Secondary | ICD-10-CM | POA: Insufficient documentation

## 2021-09-20 DIAGNOSIS — E860 Dehydration: Secondary | ICD-10-CM | POA: Diagnosis not present

## 2021-09-20 DIAGNOSIS — R55 Syncope and collapse: Secondary | ICD-10-CM | POA: Insufficient documentation

## 2021-09-20 DIAGNOSIS — S82832A Other fracture of upper and lower end of left fibula, initial encounter for closed fracture: Secondary | ICD-10-CM | POA: Insufficient documentation

## 2021-09-20 DIAGNOSIS — R6889 Other general symptoms and signs: Secondary | ICD-10-CM | POA: Diagnosis not present

## 2021-09-20 DIAGNOSIS — J45909 Unspecified asthma, uncomplicated: Secondary | ICD-10-CM | POA: Diagnosis not present

## 2021-09-20 DIAGNOSIS — Y9301 Activity, walking, marching and hiking: Secondary | ICD-10-CM | POA: Diagnosis not present

## 2021-09-20 DIAGNOSIS — S82839A Other fracture of upper and lower end of unspecified fibula, initial encounter for closed fracture: Secondary | ICD-10-CM

## 2021-09-20 DIAGNOSIS — M25572 Pain in left ankle and joints of left foot: Secondary | ICD-10-CM | POA: Diagnosis not present

## 2021-09-20 DIAGNOSIS — W108XXA Fall (on) (from) other stairs and steps, initial encounter: Secondary | ICD-10-CM | POA: Insufficient documentation

## 2021-09-20 DIAGNOSIS — I129 Hypertensive chronic kidney disease with stage 1 through stage 4 chronic kidney disease, or unspecified chronic kidney disease: Secondary | ICD-10-CM | POA: Diagnosis not present

## 2021-09-20 DIAGNOSIS — R609 Edema, unspecified: Secondary | ICD-10-CM | POA: Diagnosis not present

## 2021-09-20 DIAGNOSIS — R404 Transient alteration of awareness: Secondary | ICD-10-CM | POA: Diagnosis not present

## 2021-09-20 DIAGNOSIS — Z743 Need for continuous supervision: Secondary | ICD-10-CM | POA: Diagnosis not present

## 2021-09-20 DIAGNOSIS — I499 Cardiac arrhythmia, unspecified: Secondary | ICD-10-CM | POA: Diagnosis not present

## 2021-09-20 DIAGNOSIS — M7989 Other specified soft tissue disorders: Secondary | ICD-10-CM | POA: Diagnosis not present

## 2021-09-20 DIAGNOSIS — S99912A Unspecified injury of left ankle, initial encounter: Secondary | ICD-10-CM | POA: Diagnosis present

## 2021-09-20 DIAGNOSIS — I951 Orthostatic hypotension: Secondary | ICD-10-CM

## 2021-09-20 LAB — COMPREHENSIVE METABOLIC PANEL
ALT: 22 U/L (ref 0–44)
AST: 35 U/L (ref 15–41)
Albumin: 3.7 g/dL (ref 3.5–5.0)
Alkaline Phosphatase: 61 U/L (ref 38–126)
Anion gap: 7 (ref 5–15)
BUN: 18 mg/dL (ref 8–23)
CO2: 22 mmol/L (ref 22–32)
Calcium: 8.4 mg/dL — ABNORMAL LOW (ref 8.9–10.3)
Chloride: 109 mmol/L (ref 98–111)
Creatinine, Ser: 0.72 mg/dL (ref 0.44–1.00)
GFR, Estimated: >60 mL/min (ref >60)
Glucose, Bld: 99 mg/dL (ref 70–99)
Potassium: 4.5 mmol/L (ref 3.5–5.1)
Sodium: 138 mmol/L (ref 135–145)
Total Bilirubin: 1 mg/dL (ref 0.3–1.2)
Total Protein: 5.9 g/dL — ABNORMAL LOW (ref 6.5–8.1)

## 2021-09-20 LAB — CBC WITH DIFFERENTIAL/PLATELET
Abs Immature Granulocytes: 0.01 10*3/uL (ref 0.00–0.07)
Basophils Absolute: 0 10*3/uL (ref 0.0–0.1)
Basophils Relative: 0 %
Eosinophils Absolute: 0 10*3/uL (ref 0.0–0.5)
Eosinophils Relative: 0 %
HCT: 39.2 % (ref 36.0–46.0)
Hemoglobin: 12.5 g/dL (ref 12.0–15.0)
Immature Granulocytes: 0 %
Lymphocytes Relative: 10 %
Lymphs Abs: 1 10*3/uL (ref 0.7–4.0)
MCH: 29.6 pg (ref 26.0–34.0)
MCHC: 31.9 g/dL (ref 30.0–36.0)
MCV: 92.7 fL (ref 80.0–100.0)
Monocytes Absolute: 0.6 10*3/uL (ref 0.1–1.0)
Monocytes Relative: 6 %
Neutro Abs: 8.5 10*3/uL — ABNORMAL HIGH (ref 1.7–7.7)
Neutrophils Relative %: 84 %
Platelets: 188 10*3/uL (ref 150–400)
RBC: 4.23 MIL/uL (ref 3.87–5.11)
RDW: 13 % (ref 11.5–15.5)
WBC: 10.1 10*3/uL (ref 4.0–10.5)
nRBC: 0 % (ref 0.0–0.2)

## 2021-09-20 LAB — I-STAT CHEM 8, ED
BUN: 23 mg/dL (ref 8–23)
Calcium, Ion: 1.12 mmol/L — ABNORMAL LOW (ref 1.15–1.40)
Chloride: 109 mmol/L (ref 98–111)
Creatinine, Ser: 0.6 mg/dL (ref 0.44–1.00)
Glucose, Bld: 99 mg/dL (ref 70–99)
HCT: 36 % (ref 36.0–46.0)
Hemoglobin: 12.2 g/dL (ref 12.0–15.0)
Potassium: 4.5 mmol/L (ref 3.5–5.1)
Sodium: 142 mmol/L (ref 135–145)
TCO2: 25 mmol/L (ref 22–32)

## 2021-09-20 LAB — CBG MONITORING, ED: Glucose-Capillary: 83 mg/dL (ref 70–99)

## 2021-09-20 MED ORDER — TRAMADOL HCL 50 MG PO TABS
50.0000 mg | ORAL_TABLET | Freq: Once | ORAL | Status: AC
  Administered 2021-09-20: 50 mg via ORAL
  Filled 2021-09-20: qty 1

## 2021-09-20 MED ORDER — OXYCODONE HCL 5 MG PO TABS
5.0000 mg | ORAL_TABLET | Freq: Once | ORAL | Status: DC
  Filled 2021-09-20: qty 1

## 2021-09-20 NOTE — ED Notes (Signed)
Pt upset about wait time, explained delay

## 2021-09-20 NOTE — ED Triage Notes (Signed)
Pt from home by [facility] c/o syncopal episode. Pt reports walking down the steps and felt like she was going to pass out. Family reported syncope lasting ~15 seconds. Pt also fell this morning after missing a step, reports fracture to L ankle (ace bandage in place). EMS unable to palpate initial bp, reports orthostatic changes (automatic bp 126/45 lying, 92/47 sitting). Given 1L NS and 4mg  zofran pta.

## 2021-09-20 NOTE — ED Provider Notes (Addendum)
Emergency Medicine Provider Triage Evaluation Note  Brandi Ellis , a 70 y.o. female  was evaluated in triage.  Pt complains of lightheadedness and syncope.  Patient does not have a history of cardiac problems.  She has a history of hypertension.  Patient twisted her left ankle this morning and had an x-ray done which showed a small avulsion fracture.  She went home and laid down.  When she got up she felt very lightheaded and had a syncopal episode.  She had associated chest heaviness.  EMS was called.  Patient was very orthostatic and lightheaded.  She was given a liter of fluid.  She denies associated diarrhea but has had vomiting.  No abdominal pain or urinary symptoms.  Review of Systems  Positive: Syncope, left ankle pain, vomiting, chest heaviness Negative: Diarrhea  Physical Exam  There were no vitals taken for this visit. Gen:   Awake, no distress   Resp:  Normal effort  MSK:   L ankle is wrapped Other:  Cardiac RRR  Medical Decision Making  Medically screening exam initiated at 7:38 PM.  Appropriate orders placed.  Brandi Ellis was informed that the remainder of the evaluation will be completed by another provider, this initial triage assessment does not replace that evaluation, and the importance of remaining in the ED until their evaluation is complete.  EKG shows sinus brady [address]     Carlisle Cater, PA-C 09/20/21 1940    Carlisle Cater, PA-C 09/20/21 1941    Lucrezia Starch, MD 09/20/21 [phone]

## 2021-09-21 MED ORDER — TRAMADOL HCL 50 MG PO TABS: 50.0000 mg | ORAL_TABLET | Freq: Four times a day (QID) | ORAL | 0 refills | Status: AC | PRN

## 2021-09-21 MED ORDER — TRAMADOL HCL 50 MG PO TABS
50.0000 mg | ORAL_TABLET | Freq: Once | ORAL | Status: AC
  Administered 2021-09-21: 50 mg via ORAL
  Filled 2021-09-21: qty 1

## 2021-09-21 MED ORDER — SODIUM CHLORIDE 0.9 % IV BOLUS: 1000.0000 mL | Freq: Once | INTRAVENOUS | Status: DC

## 2021-09-21 MED ORDER — LORAZEPAM 1 MG PO TABS
0.5000 mg | ORAL_TABLET | Freq: Once | ORAL | Status: AC
  Administered 2021-09-21: 0.5 mg via ORAL
  Filled 2021-09-21: qty 1

## 2021-09-21 NOTE — ED Notes (Signed)
Orthostatic VS preformed with pt.  Pt tolerated activity well. Pt noted some lightheadedness when going from sitting to standing.  Cleared up after a moment of standing still.   EDP notified of these results.

## 2021-09-21 NOTE — ED Notes (Signed)
Pt stated she was feeling dizzy upon standing and decided to stay.

## 2021-09-21 NOTE — Progress Notes (Signed)
Orthopedic Tech Progress Note Patient Details:  VERNEAL WIERS 04-12-1951 [id number]  Ortho Devices Type of Ortho Device: Post (short leg) splint, Stirrup splint Ortho Device/Splint Location: lle Ortho Device/Splint Interventions: Ordered, Application, Adjustment   Post Interventions Patient Tolerated: Well Instructions Provided: Care of device, Adjustment of device  Karolee Stamps 09/21/2021, 5:36 AM

## 2021-09-21 NOTE — ED Provider Notes (Signed)
Patient complaining of increased pain in her foot.   X-ray from outside facility yesterday shows possible avulsion fracture.  Will place patient in a formal splint for protection and comfort.  Patient offered percocet/vicodin, but declined.   Montine Circle, PA-C 09/21/21 [place], April, MD 09/21/21 [phone]

## 2021-09-21 NOTE — ED Provider Notes (Signed)
[facility] EMERGENCY DEPARTMENT Provider Note   CSN: [id number] Arrival date & time: 09/20/21  1928     History Chief Complaint  Patient presents with   Loss of Consciousness    Brandi Ellis is a 70 y.o. female.  Pt presents to the ED today with a syncopal event.  Pt has actually been here since yesterday a little after 7 pm.  The pt fell yesterday morning and hurt her left ankle.  She went to [facility] and had an xray which showed a possible avulsion fx of her left fibula.  Pt's son was helping her up the stairs and she told  him she felt like she was going to pass out.  He sat her down and she did pass out.  EMS was called and they said she was orthostatic.  They gave her 1L NS en route.  Pt has been waiting all night and is understandably frustrated.  Her IV was taken out per her request while she waited.  She had a sugar tong splint applied to her ankle overnight while she waited.  She was also given crutches.  Pt was going to leave, but still felt dizzy, so decided to stay.  Right now, she said her nerves are a mess.  She denies any cp or sob.      Past Medical History:  Diagnosis Date   Anxiety    Anxiety    Asthma    Chronic kidney disease    Hyperlipidemia    Hypertension     Patient Active Problem List   Diagnosis Date Noted   Pain in limb 05/28/2014    Past Surgical History:  Procedure Laterality Date   BREAST BIOPSY Right    CESAREAN SECTION  1972 and [facility]       OB History   No obstetric history on file.     Family History  Problem Relation Age of Onset   Cancer Mother    Hyperlipidemia Mother    Hypertension Mother    Varicose Veins Mother    Heart disease Father        before age 34   Hypertension Father    Hyperlipidemia Sister    Hypertension Sister    Cancer Brother    Heart disease Brother        before age 85   Hyperlipidemia Brother    Hypertension Brother     Social History   Tobacco Use    Smoking status: Never   Smokeless tobacco: Never  Substance Use Topics   Alcohol use: No   Drug use: No    Home Medications Prior to Admission medications   Medication Sig Start Date End Date Taking? Authorizing Provider  traMADol (ULTRAM) 50 MG tablet Take 1 tablet (50 mg total) by mouth every 6 (six) hours as needed. 09/21/21  Yes Isla Pence, MD  ALBUTEROL IN Inhale 2.5 mg into the lungs 3 (three) times daily.    [provider]  amLODipine (NORVASC) 5 MG tablet Take 5 mg by mouth daily.    [provider]  Calcium Carbonate-Vitamin D (CALTRATE 600+D) 600-400 MG-UNIT per tablet Take 1 tablet by mouth daily.    [provider]  cephALEXin (KEFLEX) 500 MG capsule  08/25/14   [provider]  cetirizine (ZYRTEC) 10 MG tablet Take 10 mg by mouth daily.    [provider]  escitalopram (LEXAPRO) 10 MG tablet Take 10 mg by mouth daily.  [provider]  fluticasone Asencion Islam) 50 MCG/ACT nasal spray  10/16/14   [provider]  FLUTICASONE PROPIONATE NA Place 50 mcg into the nose.    [provider]  hydrochlorothiazide (MICROZIDE) 12.5 MG capsule Take 12.5 mg by mouth daily.    [provider]  LORazepam (ATIVAN) 1 MG tablet Take 1 mg by mouth every 8 (eight) hours.    [provider]  meclizine (ANTIVERT) 25 MG tablet Take 25 mg by mouth 3 (three) times daily as needed for dizziness.    [provider]  meloxicam (MOBIC) 7.5 MG tablet Take 7.5 mg by mouth as needed for pain.    [provider]  montelukast (SINGULAIR) 10 MG tablet Take 10 mg by mouth at bedtime.    [provider]  potassium chloride (K-DUR) 10 MEQ tablet  10/30/14   [provider]  potassium chloride (MICRO-K) 10 MEQ CR capsule Take 10 mEq by mouth daily.    [provider]  triamcinolone cream (KENALOG) 0.1 %  08/25/14   [provider]    Allergies    Codeine sulfate, Lipitor  [atorvastatin], Omnipaque [iohexol], Sulfa antibiotics, and Azithromycin  Review of Systems   Review of Systems  Musculoskeletal:        Left ankle pain  Neurological:  Positive for syncope.  All other systems reviewed and are negative.  Physical Exam Updated Vital Signs BP (!) 122/55 (BP Location: Left Arm)   Pulse 64   Temp 97.9 F (36.6 C) (Oral)   Resp 16   SpO2 100%   Physical Exam Vitals and nursing note reviewed.  Constitutional:      Appearance: Normal appearance.  HENT:     Head: Normocephalic and atraumatic.     Right Ear: External ear normal.     Left Ear: External ear normal.     Nose: Nose normal.     Mouth/Throat:     Mouth: Mucous membranes are moist.     Pharynx: Oropharynx is clear.  Eyes:     Extraocular Movements: Extraocular movements intact.     Conjunctiva/sclera: Conjunctivae normal.     Pupils: Pupils are equal, round, and reactive to light.  Cardiovascular:     Rate and Rhythm: Normal rate and regular rhythm.     Pulses: Normal pulses.     Heart sounds: Normal heart sounds.  Pulmonary:     Effort: Pulmonary effort is normal.     Breath sounds: Normal breath sounds.  Abdominal:     General: Abdomen is flat. Bowel sounds are normal.     Palpations: Abdomen is soft.  Musculoskeletal:     Cervical back: Normal range of motion and neck supple.     Comments: Left ankle in a sugar tong splint  Skin:    General: Skin is warm.     Capillary Refill: Capillary refill takes less than 2 seconds.  Neurological:     General: No focal deficit present.     Mental Status: She is alert and oriented to person, place, and time.  Psychiatric:        Mood and Affect: Mood normal.        Behavior: Behavior normal.    ED Results / Procedures / Treatments   Labs (all labs ordered are listed, but only abnormal results are displayed) Labs Reviewed  COMPREHENSIVE METABOLIC PANEL - Abnormal; Notable for the following components:      Result Value   Calcium  8.4 (*)  Total Protein 5.9 (*)    All other components within normal limits  CBC WITH DIFFERENTIAL/PLATELET - Abnormal; Notable for the following components:   Neutro Abs 8.5 (*)    All other components within normal limits  I-STAT CHEM 8, ED - Abnormal; Notable for the following components:   Calcium, Ion 1.12 (*)    All other components within normal limits  CBC WITH DIFFERENTIAL/PLATELET  URINALYSIS, ROUTINE W REFLEX MICROSCOPIC  CBG MONITORING, ED  CBG MONITORING, ED  TROPONIN I (HIGH SENSITIVITY)  TROPONIN I (HIGH SENSITIVITY)    EKG EKG Interpretation  Date/Time:  Monday September 20 2021 19:31:06 EDT Ventricular Rate:  59 PR Interval:  146 QRS Duration: 84 QT Interval:  450 QTC Calculation: 445 R Axis:   77 Text Interpretation: Sinus bradycardia Otherwise normal ECG No old tracing to compare Confirmed by Isla Pence [phone]) on 09/21/2021 11:11:21 AM  Radiology DG Chest 2 View  Result Date: 09/20/2021 CLINICAL DATA:  Syncope. EXAM: CHEST - 2 VIEW COMPARISON:  None. FINDINGS: There is no evidence of acute infiltrate, pleural effusion or pneumothorax. The heart size and mediastinal contours are within normal limits. The visualized skeletal structures are unremarkable. IMPRESSION: No active cardiopulmonary disease. Electronically Signed   By: Virgina Norfolk M.D.   On: 09/20/2021 20:47    Procedures Procedures   Medications Ordered in ED Medications  oxyCODONE (Oxy IR/ROXICODONE) immediate release tablet 5 mg (5 mg Oral Patient Refused/Not Given 09/20/21 2118)  sodium chloride 0.9 % bolus 1,000 mL (has no administration in time range)  traMADol (ULTRAM) tablet 50 mg (50 mg Oral Given 09/20/21 2117)  traMADol (ULTRAM) tablet 50 mg (50 mg Oral Given 09/21/21 0445)  LORazepam (ATIVAN) tablet 0.5 mg (0.5 mg Oral Given 09/21/21 1205)  traMADol (ULTRAM) tablet 50 mg (50 mg Oral Given 09/21/21 1204)    ED Course  I have reviewed the triage vital signs and the nursing  notes.  Pertinent labs & imaging results that were available during my care of the patient were reviewed by me and considered in my medical decision making (see chart for details).    MDM Rules/Calculators/A&P                           Pt's splint removed and she was put in a cam walker.  I think that is more appropriate for a fibular avulsion injury in this patient.  This way, she does not have to use crutches.  I think crutches are not ideal in this patient.  Pt does have a walker at home that she can use.  She prefers to f/u with [facility].  Pt is feeling much better after IVFs.  She is likely syncopal due to orthostasis.  Pt is stable for d/c.  Return if worse.     Final Clinical Impression(s) / ED Diagnoses Final diagnoses:  Dehydration  Orthostatic syncope  Sprain of left ankle, unspecified ligament, initial encounter  Avulsion fracture of distal fibula    Rx / DC Orders ED Discharge Orders          Ordered    traMADol (ULTRAM) 50 MG tablet  Every 6 hours PRN        09/21/21 1343             Isla Pence, MD 09/21/21 1346

## 2021-09-21 NOTE — ED Notes (Signed)
Pt stated she was leaving. Pt encouraged to stay. Pt is indecisive.

## 2021-09-21 NOTE — ED Notes (Signed)
Pt assisted to rest room in chair. Patient reports no dizziness or lightheadedness upon movement.

## 2021-09-21 NOTE — ED Notes (Signed)
Patient's son called and instructed to retrieve car, patient wheeled to lobby in wheelchair for son to pick up.

## 2021-09-21 NOTE — ED Notes (Signed)
Pt pulled back from lobby for increased pain to L ankle and into hip. Pt very upset and concerned regarding the wait time. Pt requested IV be removed. PA reassessing

## 2021-09-21 NOTE — ED Notes (Signed)
Ortho aware of need for splint 

## 2021-09-21 NOTE — Progress Notes (Signed)
Orthopedic Tech Progress Note Patient Details:  Brandi Ellis Dec 04, 1951 [id number]  I removed the SHORT LEG  WITH STIRRUPS to apply a CAM WALKER   Ortho Devices Type of Ortho Device: CAM walker Ortho Device/Splint Location: RLE Ortho Device/Splint Interventions: Ordered, Application, Adjustment   Post Interventions Patient Tolerated: Well Instructions Provided: [facility] [place] 09/21/2021, 12:45 PM

## 2021-09-27 DIAGNOSIS — S8265XA Nondisplaced fracture of lateral malleolus of left fibula, initial encounter for closed fracture: Secondary | ICD-10-CM | POA: Diagnosis not present

## 2021-09-27 DIAGNOSIS — S93492A Sprain of other ligament of left ankle, initial encounter: Secondary | ICD-10-CM | POA: Diagnosis not present

## 2021-09-28 ENCOUNTER — Other Ambulatory Visit: Payer: Self-pay | Admitting: Family Medicine

## 2021-09-28 DIAGNOSIS — Z1231 Encounter for screening mammogram for malignant neoplasm of breast: Secondary | ICD-10-CM

## 2021-10-19 DIAGNOSIS — E538 Deficiency of other specified B group vitamins: Secondary | ICD-10-CM | POA: Diagnosis not present

## 2021-10-19 DIAGNOSIS — I10 Essential (primary) hypertension: Secondary | ICD-10-CM | POA: Diagnosis not present

## 2021-10-19 DIAGNOSIS — E785 Hyperlipidemia, unspecified: Secondary | ICD-10-CM | POA: Diagnosis not present

## 2021-10-19 DIAGNOSIS — Z0000 Encounter for general adult medical examination without abnormal findings: Secondary | ICD-10-CM | POA: Diagnosis not present

## 2021-10-19 DIAGNOSIS — Z131 Encounter for screening for diabetes mellitus: Secondary | ICD-10-CM | POA: Diagnosis not present

## 2021-10-19 DIAGNOSIS — J4520 Mild intermittent asthma, uncomplicated: Secondary | ICD-10-CM | POA: Diagnosis not present

## 2021-10-25 DIAGNOSIS — S8265XD Nondisplaced fracture of lateral malleolus of left fibula, subsequent encounter for closed fracture with routine healing: Secondary | ICD-10-CM | POA: Diagnosis not present

## 2021-10-25 DIAGNOSIS — S93492D Sprain of other ligament of left ankle, subsequent encounter: Secondary | ICD-10-CM | POA: Diagnosis not present

## 2021-10-27 DIAGNOSIS — R2689 Other abnormalities of gait and mobility: Secondary | ICD-10-CM | POA: Diagnosis not present

## 2021-10-27 DIAGNOSIS — M25572 Pain in left ankle and joints of left foot: Secondary | ICD-10-CM | POA: Diagnosis not present

## 2021-10-27 DIAGNOSIS — M6281 Muscle weakness (generalized): Secondary | ICD-10-CM | POA: Diagnosis not present

## 2021-10-29 ENCOUNTER — Ambulatory Visit: Payer: Medicare Other

## 2021-11-02 DIAGNOSIS — M6281 Muscle weakness (generalized): Secondary | ICD-10-CM | POA: Diagnosis not present

## 2021-11-02 DIAGNOSIS — R2689 Other abnormalities of gait and mobility: Secondary | ICD-10-CM | POA: Diagnosis not present

## 2021-11-02 DIAGNOSIS — M25572 Pain in left ankle and joints of left foot: Secondary | ICD-10-CM | POA: Diagnosis not present

## 2021-11-04 DIAGNOSIS — M25572 Pain in left ankle and joints of left foot: Secondary | ICD-10-CM | POA: Diagnosis not present

## 2021-11-04 DIAGNOSIS — M6281 Muscle weakness (generalized): Secondary | ICD-10-CM | POA: Diagnosis not present

## 2021-11-04 DIAGNOSIS — R2689 Other abnormalities of gait and mobility: Secondary | ICD-10-CM | POA: Diagnosis not present

## 2021-11-16 DIAGNOSIS — R300 Dysuria: Secondary | ICD-10-CM | POA: Diagnosis not present

## 2021-11-17 DIAGNOSIS — S93492D Sprain of other ligament of left ankle, subsequent encounter: Secondary | ICD-10-CM | POA: Diagnosis not present

## 2021-11-17 DIAGNOSIS — M25572 Pain in left ankle and joints of left foot: Secondary | ICD-10-CM | POA: Diagnosis not present

## 2021-11-25 DIAGNOSIS — R300 Dysuria: Secondary | ICD-10-CM | POA: Diagnosis not present

## 2021-11-30 DIAGNOSIS — M25572 Pain in left ankle and joints of left foot: Secondary | ICD-10-CM | POA: Diagnosis not present

## 2021-12-03 ENCOUNTER — Ambulatory Visit
Admission: RE | Admit: 2021-12-03 | Discharge: 2021-12-03 | Disposition: A | Discharge status: Home / Self Care | Payer: Medicare Other | Source: Ambulatory Visit | Attending: Family Medicine | Admitting: Family Medicine

## 2021-12-03 DIAGNOSIS — Z1231 Encounter for screening mammogram for malignant neoplasm of breast: Secondary | ICD-10-CM

## 2021-12-06 DIAGNOSIS — S93492D Sprain of other ligament of left ankle, subsequent encounter: Secondary | ICD-10-CM | POA: Diagnosis not present

## 2021-12-15 DIAGNOSIS — J988 Other specified respiratory disorders: Secondary | ICD-10-CM | POA: Diagnosis not present

## 2021-12-15 DIAGNOSIS — B9789 Other viral agents as the cause of diseases classified elsewhere: Secondary | ICD-10-CM | POA: Diagnosis not present

## 2021-12-20 DIAGNOSIS — B9789 Other viral agents as the cause of diseases classified elsewhere: Secondary | ICD-10-CM | POA: Diagnosis not present

## 2021-12-20 DIAGNOSIS — J329 Chronic sinusitis, unspecified: Secondary | ICD-10-CM | POA: Diagnosis not present

## 2021-12-27 DIAGNOSIS — S93492D Sprain of other ligament of left ankle, subsequent encounter: Secondary | ICD-10-CM | POA: Diagnosis not present

## 2022-10-17 ENCOUNTER — Other Ambulatory Visit: Payer: Self-pay | Admitting: Nurse Practitioner

## 2022-10-17 DIAGNOSIS — Z1231 Encounter for screening mammogram for malignant neoplasm of breast: Secondary | ICD-10-CM

## 2022-11-11 IMAGING — MG MM DIGITAL SCREENING BILAT W/ TOMO AND CAD
6 of 10 series · 6 of 30 positions shown · non-contrast
Comparison: Previous exam(s).

CLINICAL DATA: Screening.

EXAM:
DIGITAL SCREENING BILATERAL MAMMOGRAM WITH TOMOSYNTHESIS AND CAD
TECHNIQUE: Bilateral screening digital craniocaudal and mediolateral oblique
mammograms were obtained. Bilateral screening digital breast
tomosynthesis was performed. The images were evaluated with
computer-aided detection.

[L MLO synth-2D (1 of 2)]
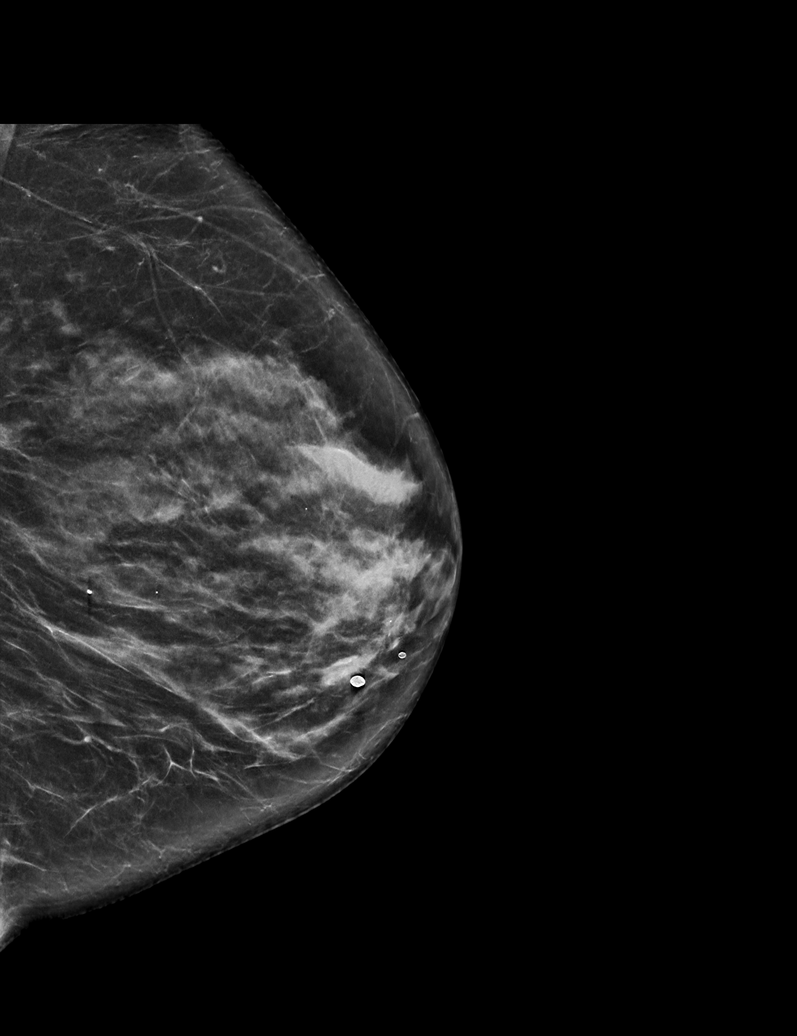

[L CC synth-2D]
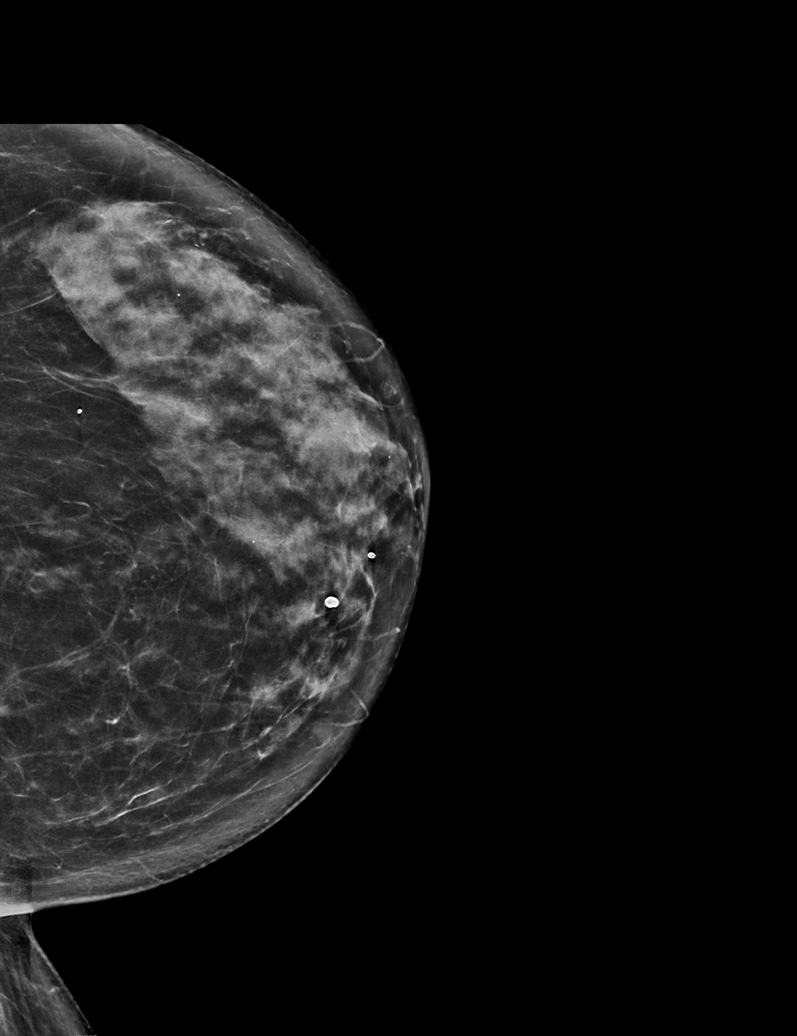

[R MLO synth-2D]
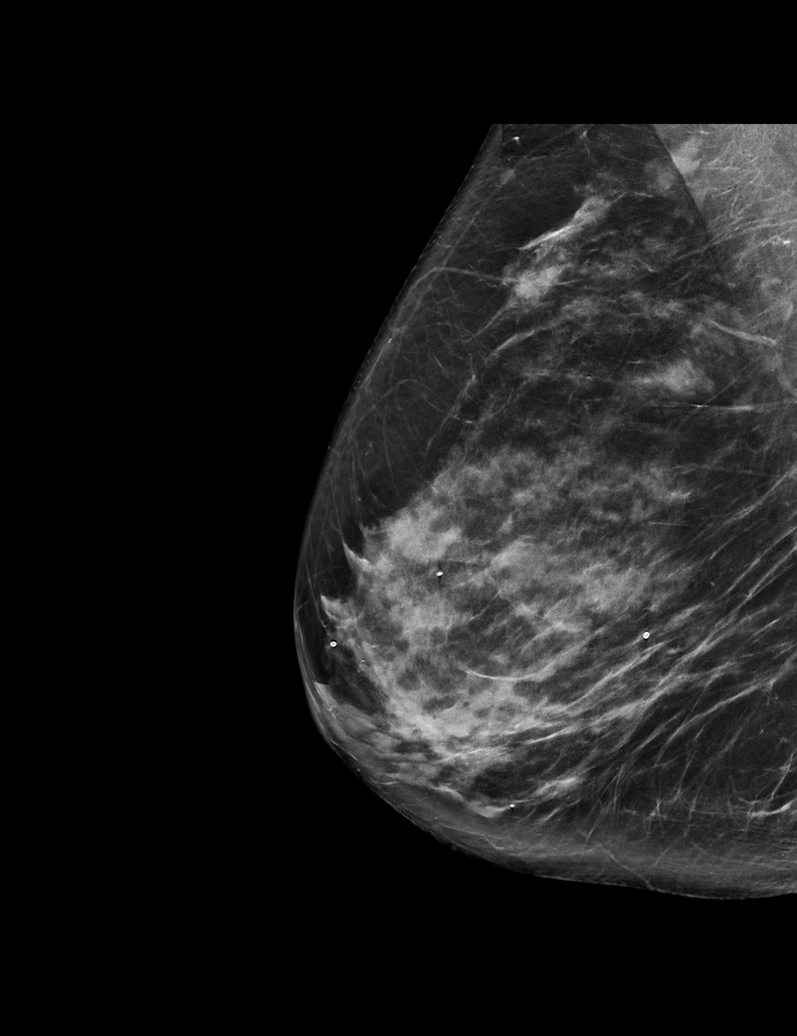

[L MLO synth-2D (2 of 2)]
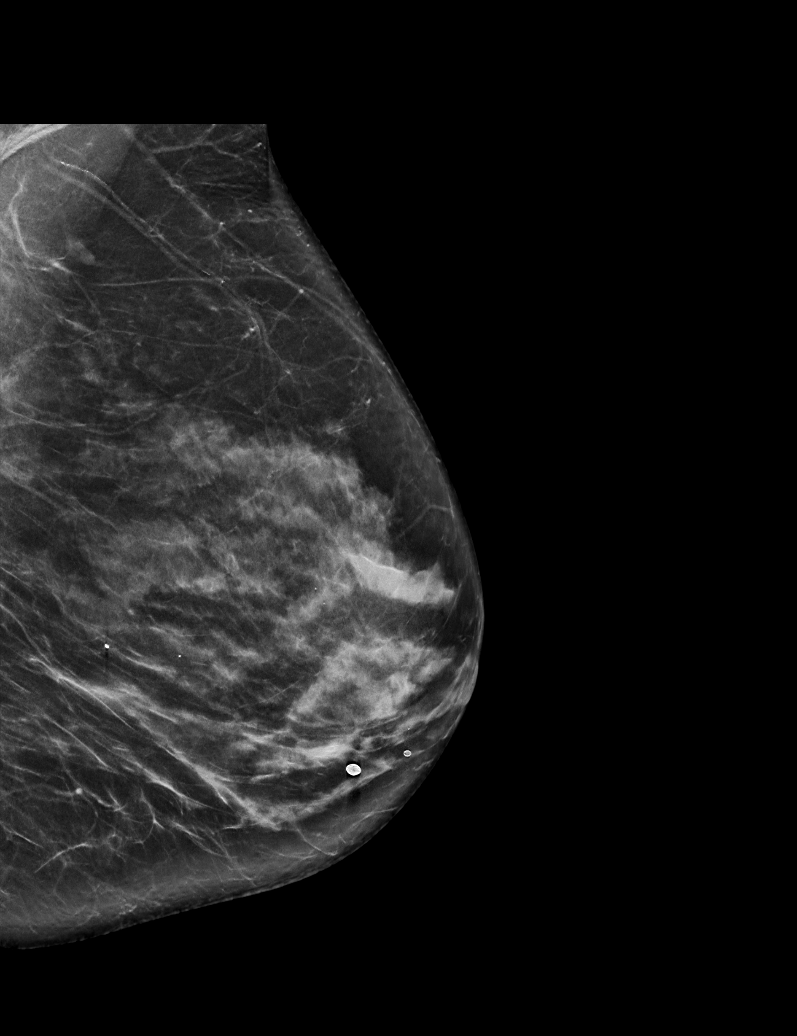

[R CC synth-2D]
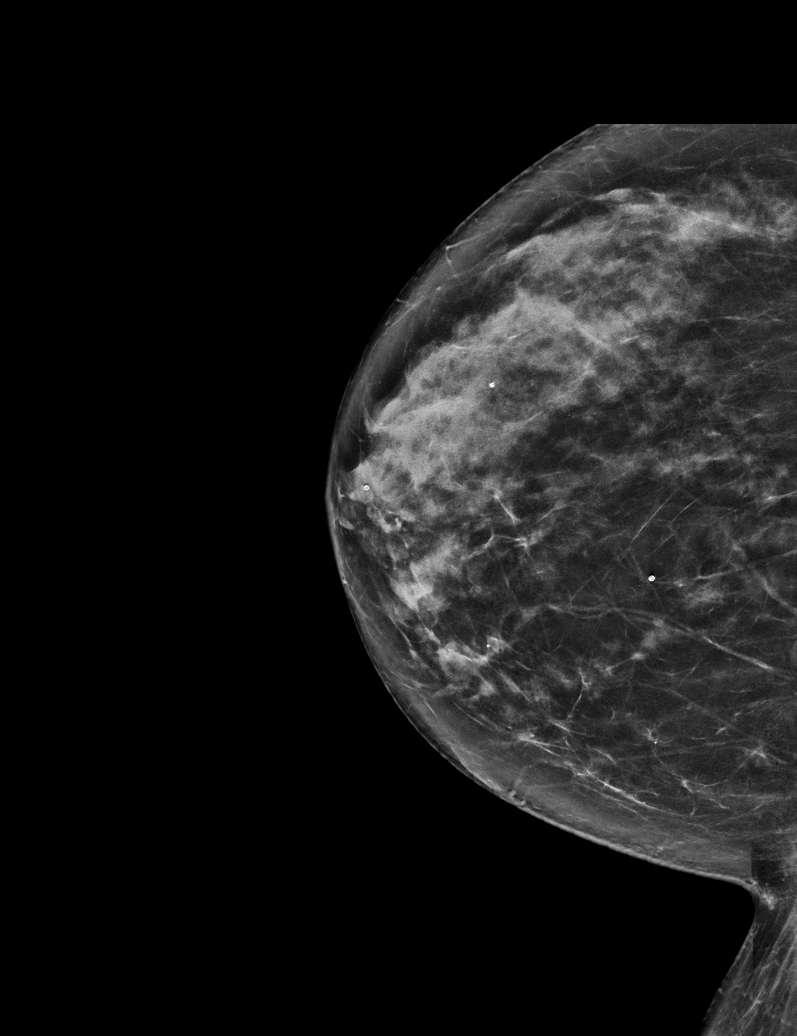

[L MLO tomo · tomo slice 35/68.0]
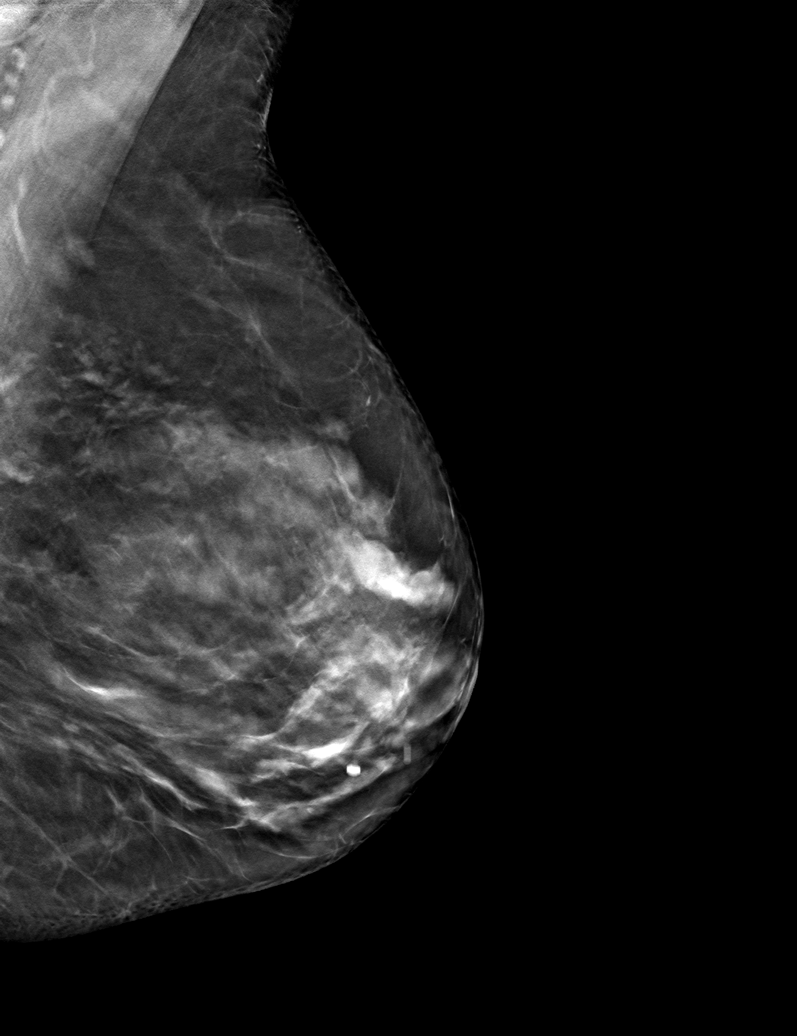

[6 of 30 positions shown; findings below may reference images not displayed]

ACR Breast Density Category c: The breast tissue is heterogeneously
dense, which may obscure small masses.
FINDINGS: There are no findings suspicious for malignancy.
IMPRESSION: No mammographic evidence of malignancy. A result letter of this
screening mammogram will be mailed directly to the patient.

RECOMMENDATION:
Screening mammogram in one year. (Code:Q3-W-BC3)

BI-RADS CATEGORY  1: Negative.

## 2022-12-29 ENCOUNTER — Ambulatory Visit: Payer: Medicare Other

## 2023-07-26 ENCOUNTER — Ambulatory Visit
Admission: RE | Admit: 2023-07-26 | Discharge: 2023-07-26 | Disposition: A | Discharge status: Home / Self Care | Payer: Medicare Other | Source: Ambulatory Visit | Attending: Nurse Practitioner | Admitting: Nurse Practitioner

## 2023-07-26 DIAGNOSIS — Z1231 Encounter for screening mammogram for malignant neoplasm of breast: Secondary | ICD-10-CM

## 2023-07-31 ENCOUNTER — Other Ambulatory Visit: Payer: Self-pay | Admitting: Nurse Practitioner

## 2023-07-31 DIAGNOSIS — R928 Other abnormal and inconclusive findings on diagnostic imaging of breast: Secondary | ICD-10-CM

## 2023-08-07 ENCOUNTER — Ambulatory Visit: Payer: Medicare Other

## 2023-08-07 DIAGNOSIS — R928 Other abnormal and inconclusive findings on diagnostic imaging of breast: Secondary | ICD-10-CM

## 2023-08-11 ENCOUNTER — Other Ambulatory Visit: Payer: Medicare Other

## 2023-11-14 ENCOUNTER — Other Ambulatory Visit (HOSPITAL_COMMUNITY): Payer: Self-pay | Admitting: Interventional Radiology

## 2023-11-14 DIAGNOSIS — S22040A Wedge compression fracture of fourth thoracic vertebra, initial encounter for closed fracture: Secondary | ICD-10-CM

## 2023-11-14 DIAGNOSIS — S22030A Wedge compression fracture of third thoracic vertebra, initial encounter for closed fracture: Secondary | ICD-10-CM

## 2023-11-21 ENCOUNTER — Ambulatory Visit (HOSPITAL_COMMUNITY)
Admission: RE | Admit: 2023-11-21 | Discharge: 2023-11-21 | Disposition: A | Discharge status: Home / Self Care | Payer: Medicare Other | Source: Ambulatory Visit | Attending: Interventional Radiology | Admitting: Interventional Radiology

## 2023-11-21 DIAGNOSIS — S22030A Wedge compression fracture of third thoracic vertebra, initial encounter for closed fracture: Secondary | ICD-10-CM

## 2023-11-21 DIAGNOSIS — S22040A Wedge compression fracture of fourth thoracic vertebra, initial encounter for closed fracture: Secondary | ICD-10-CM

## 2023-11-22 HISTORY — PX: IR RADIOLOGIST EVAL & MGMT: IMG5224

## 2024-06-15 ENCOUNTER — Encounter (HOSPITAL_COMMUNITY): Payer: Self-pay | Admitting: Interventional Radiology
